# Patient Record
Sex: Male | Born: 1990 | Race: Black or African American | Hispanic: No | Marital: Single | Smoking: Current some day smoker
Health system: Southern US, Community
[De-identification: ages and names within clinical notes are randomized; demographics above are authoritative.]

---

## 2013-05-01 ENCOUNTER — Emergency Department (HOSPITAL_COMMUNITY)
Admission: EM | Admit: 2013-05-01 | Discharge: 2013-05-01 | Disposition: A | Discharge status: Home / Self Care | Payer: BC Managed Care – PPO | Attending: Emergency Medicine | Admitting: Emergency Medicine

## 2013-05-01 ENCOUNTER — Encounter (HOSPITAL_COMMUNITY): Payer: Self-pay | Admitting: Emergency Medicine

## 2013-05-01 DIAGNOSIS — H6121 Impacted cerumen, right ear: Secondary | ICD-10-CM

## 2013-05-01 DIAGNOSIS — H6691 Otitis media, unspecified, right ear: Secondary | ICD-10-CM

## 2013-05-01 DIAGNOSIS — H6120 Impacted cerumen, unspecified ear: Secondary | ICD-10-CM | POA: Insufficient documentation

## 2013-05-01 DIAGNOSIS — Z792 Long term (current) use of antibiotics: Secondary | ICD-10-CM | POA: Insufficient documentation

## 2013-05-01 DIAGNOSIS — Z880 Allergy status to penicillin: Secondary | ICD-10-CM | POA: Insufficient documentation

## 2013-05-01 DIAGNOSIS — H6690 Otitis media, unspecified, unspecified ear: Secondary | ICD-10-CM | POA: Insufficient documentation

## 2013-05-01 MED ORDER — ANTIPYRINE-BENZOCAINE 5.4-1.4 % OT SOLN
3.0000 [drp] | Freq: Once | OTIC | Status: AC
  Administered 2013-05-01: 3 [drp] via OTIC
  Filled 2013-05-01: qty 10

## 2013-05-01 MED ORDER — AZITHROMYCIN 250 MG PO TABS
500.0000 mg | ORAL_TABLET | Freq: Once | ORAL | Status: AC
  Administered 2013-05-01: 500 mg via ORAL
  Filled 2013-05-01: qty 2

## 2013-05-01 MED ORDER — AZITHROMYCIN 250 MG PO TABS: 250.0000 mg | ORAL_TABLET | Freq: Every day | ORAL | Status: DC

## 2013-05-01 NOTE — ED Notes (Signed)
Pt states that ear pain started Saturday and he noticed that ear wax was clogging his ear. Pt tried to removed wax with products from [facility] and still remains with wax and a unable to fully hear from R ear.

## 2013-05-01 NOTE — ED Provider Notes (Signed)
CSN: [id number]     Arrival date & time 05/01/13  2034 History  This chart was scribed for non-physician practitioner Earley Favor, NP working with Doug Sou, MD by Valera Castle, ED scribe. This patient was seen in room WTR9/WTR9 and the patient's care was started at 9:53 PM.   Chief Complaint  Patient presents with  . Cerumen Impaction    The history is provided by the patient. No language interpreter was used.   HPI Comments: Rodney Cooper is a 22 y.o. male who presents to the Emergency Department complaining of sudden clogged right ear, onset 3 days ago. Pt has tried to remove wax with [facility] products without success. He reports mild loss of hearing from his right ear. He states he has not seen the wax, but states he can feel that his ear is clogged. He reports a h/o similar symptoms, but states he can usually get his ear unclogged himself. Pt denies cold symptoms, rhinorrhea, and any other associated symptoms. He reports an allergy to Penicillin. Pt has no medical history.   PCP - No primary provider on file.  History reviewed. No pertinent past medical history. History reviewed. No pertinent past surgical history. History reviewed. No pertinent family history. History  Substance Use Topics  . Smoking status: Never Smoker   . Smokeless tobacco: Not on file  . Alcohol Use: Yes    Review of Systems  HENT: Positive for ear pain and hearing loss (right). Negative for rhinorrhea.        Positive for right ear clogging.  Neurological: Negative for dizziness and headaches.  All other systems reviewed and are negative.    Allergies  Amoxicillin and Penicillins  Home Medications   Current Outpatient Rx  Name  Route  Sig  Dispense  Refill  . carbamide peroxide (EAR DROPS EARWAX AID) 6.5 % otic solution   Right Ear   Place 5 drops into the right ear 2 (two) times daily.         Marland Kitchen azithromycin (ZITHROMAX) 250 MG tablet   Oral   Take 1 tablet (250 mg total) by mouth daily.   4  tablet   0    BP 118/67  Pulse 59  Temp(Src) 97.3 F (36.3 C) (Oral)  Resp 18  Wt 150 lb (68.04 kg)  SpO2 100%  Physical Exam  Nursing note and vitals reviewed. Constitutional: He is oriented to person, place, and time. He appears well-developed and well-nourished. No distress.  HENT:  Head: Normocephalic and atraumatic.  Right Ear: No swelling or tenderness. Decreased hearing is noted.  Left Ear: External ear normal.  Partial cerumen impaction  Eyes: EOM are normal.  Neck: Neck supple. No tracheal deviation present.  Cardiovascular: Normal rate.   Pulmonary/Chest: Effort normal. No respiratory distress.  Musculoskeletal: Normal range of motion.  Neurological: He is alert and oriented to person, place, and time.  Skin: Skin is warm and dry.  Psychiatric: He has a normal mood and affect. His behavior is normal.    ED Course  Procedures (including critical care time)  DIAGNOSTIC STUDIES: Oxygen Saturation is 100% on room air, normal by my interpretation.    COORDINATION OF CARE: 9:56 PM-Discussed treatment plan with pt at bedside and pt agreed to plan.   Labs Review Labs Reviewed - No data to display Imaging Review No results found.  EKG Interpretation   None       MDM   1. Cerumen impaction, right   2. Otitis media of  right ear     Ear was flushed, revealing a otitis media.  He has been started on azithromycin to to his penicillin allergy is also been given Auralgan  for comfort    I personally performed the services described in this documentation, which was scribed in my presence. The recorded information has been reviewed and is accurate.   Arman Filter, NP 05/01/13 2312

## 2013-05-01 NOTE — ED Provider Notes (Signed)
Medical screening examination/treatment/procedure(s) were performed by non-physician practitioner and as supervising physician I was immediately available for consultation/collaboration.  EKG Interpretation   None        Doug Sou, MD 05/01/13 [phone]

## 2014-03-07 ENCOUNTER — Emergency Department (HOSPITAL_COMMUNITY)
Admission: EM | Admit: 2014-03-07 | Discharge: 2014-03-08 | Disposition: A | Discharge status: Home / Self Care | Payer: Managed Care, Other (non HMO) | Attending: Emergency Medicine | Admitting: Emergency Medicine

## 2014-03-07 DIAGNOSIS — Y92410 Unspecified street and highway as the place of occurrence of the external cause: Secondary | ICD-10-CM | POA: Insufficient documentation

## 2014-03-07 DIAGNOSIS — Z792 Long term (current) use of antibiotics: Secondary | ICD-10-CM | POA: Insufficient documentation

## 2014-03-07 DIAGNOSIS — Z79899 Other long term (current) drug therapy: Secondary | ICD-10-CM | POA: Insufficient documentation

## 2014-03-07 DIAGNOSIS — S29012A Strain of muscle and tendon of back wall of thorax, initial encounter: Secondary | ICD-10-CM | POA: Diagnosis not present

## 2014-03-07 DIAGNOSIS — Y9389 Activity, other specified: Secondary | ICD-10-CM | POA: Diagnosis not present

## 2014-03-07 DIAGNOSIS — S39012A Strain of muscle, fascia and tendon of lower back, initial encounter: Secondary | ICD-10-CM | POA: Diagnosis not present

## 2014-03-07 DIAGNOSIS — Z880 Allergy status to penicillin: Secondary | ICD-10-CM | POA: Diagnosis not present

## 2014-03-07 DIAGNOSIS — S3992XA Unspecified injury of lower back, initial encounter: Secondary | ICD-10-CM | POA: Diagnosis present

## 2014-03-07 DIAGNOSIS — T148XXA Other injury of unspecified body region, initial encounter: Secondary | ICD-10-CM

## 2014-03-08 ENCOUNTER — Encounter (HOSPITAL_COMMUNITY): Payer: Self-pay | Admitting: Emergency Medicine

## 2014-03-08 DIAGNOSIS — S39012A Strain of muscle, fascia and tendon of lower back, initial encounter: Secondary | ICD-10-CM | POA: Diagnosis not present

## 2014-03-08 MED ORDER — HYDROCODONE-ACETAMINOPHEN 5-325 MG PO TABS
2.0000 | ORAL_TABLET | Freq: Once | ORAL | Status: AC
  Administered 2014-03-08: 2 via ORAL
  Filled 2014-03-08: qty 2

## 2014-03-08 MED ORDER — HYDROCODONE-ACETAMINOPHEN 5-325 MG PO TABS: 1.0000 | ORAL_TABLET | ORAL | Status: DC | PRN

## 2014-03-08 MED ORDER — CYCLOBENZAPRINE HCL 10 MG PO TABS: 10.0000 mg | ORAL_TABLET | Freq: Two times a day (BID) | ORAL | Status: DC | PRN

## 2014-03-08 MED ORDER — CYCLOBENZAPRINE HCL 10 MG PO TABS
10.0000 mg | ORAL_TABLET | Freq: Once | ORAL | Status: AC
  Administered 2014-03-08: 10 mg via ORAL
  Filled 2014-03-08: qty 1

## 2014-03-08 NOTE — ED Notes (Signed)
Pt ambulatory upon discharge. Pt denies pain.

## 2014-03-08 NOTE — ED Provider Notes (Signed)
Medical screening examination/treatment/procedure(s) were performed by non-physician practitioner and as supervising physician I was immediately available for consultation/collaboration.   EKG Interpretation None        Matthew Gentry, MD 03/08/14 2250 
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
## 2014-03-08 NOTE — Discharge Instructions (Signed)
Take Vicodin as needed for pain. Take Flexeril as needed for muscle spasm. Refer to attached documents for more information.  °

## 2014-07-16 ENCOUNTER — Encounter (HOSPITAL_COMMUNITY): Payer: Self-pay | Admitting: *Deleted

## 2014-07-16 ENCOUNTER — Emergency Department (HOSPITAL_COMMUNITY)
Admission: EM | Admit: 2014-07-16 | Discharge: 2014-07-16 | Disposition: A | Discharge status: Home / Self Care | Payer: Managed Care, Other (non HMO) | Attending: Emergency Medicine | Admitting: Emergency Medicine

## 2014-07-16 ENCOUNTER — Emergency Department (HOSPITAL_COMMUNITY): Payer: Managed Care, Other (non HMO)

## 2014-07-16 DIAGNOSIS — Z880 Allergy status to penicillin: Secondary | ICD-10-CM | POA: Insufficient documentation

## 2014-07-16 DIAGNOSIS — Y9389 Activity, other specified: Secondary | ICD-10-CM | POA: Diagnosis not present

## 2014-07-16 DIAGNOSIS — S060X1A Concussion with loss of consciousness of 30 minutes or less, initial encounter: Secondary | ICD-10-CM | POA: Diagnosis not present

## 2014-07-16 DIAGNOSIS — S0990XA Unspecified injury of head, initial encounter: Secondary | ICD-10-CM | POA: Diagnosis present

## 2014-07-16 DIAGNOSIS — Y998 Other external cause status: Secondary | ICD-10-CM | POA: Insufficient documentation

## 2014-07-16 DIAGNOSIS — Y92410 Unspecified street and highway as the place of occurrence of the external cause: Secondary | ICD-10-CM | POA: Diagnosis not present

## 2014-07-16 LAB — CBC WITH DIFFERENTIAL/PLATELET
BASOS ABS: 0 10*3/uL (ref 0.0–0.1)
Basophils Relative: 0 % (ref 0–1)
Eosinophils Absolute: 0.2 10*3/uL (ref 0.0–0.7)
Eosinophils Relative: 2 % (ref 0–5)
HCT: 45.1 % (ref 39.0–52.0)
Hemoglobin: 15.5 g/dL (ref 13.0–17.0)
LYMPHS ABS: 3.8 10*3/uL (ref 0.7–4.0)
LYMPHS PCT: 42 % (ref 12–46)
MCH: 30.3 pg (ref 26.0–34.0)
MCHC: 34.4 g/dL (ref 30.0–36.0)
MCV: 88.3 fL (ref 78.0–100.0)
MONO ABS: 0.7 10*3/uL (ref 0.1–1.0)
Monocytes Relative: 8 % (ref 3–12)
NEUTROS ABS: 4.5 10*3/uL (ref 1.7–7.7)
NEUTROS PCT: 48 % (ref 43–77)
Platelets: 245 10*3/uL (ref 150–400)
RBC: 5.11 MIL/uL (ref 4.22–5.81)
RDW: 13.8 % (ref 11.5–15.5)
WBC: 9.2 10*3/uL (ref 4.0–10.5)

## 2014-07-16 LAB — COMPREHENSIVE METABOLIC PANEL
ALBUMIN: 4.5 g/dL (ref 3.5–5.2)
ALK PHOS: 48 U/L (ref 39–117)
ALT: 23 U/L (ref 0–53)
AST: 18 U/L (ref 0–37)
Anion gap: 10 (ref 5–15)
BILIRUBIN TOTAL: 0.7 mg/dL (ref 0.3–1.2)
BUN: 9 mg/dL (ref 6–23)
CO2: 21 mmol/L (ref 19–32)
Calcium: 9.4 mg/dL (ref 8.4–10.5)
Chloride: 109 mmol/L (ref 96–112)
Creatinine, Ser: 1.07 mg/dL (ref 0.50–1.35)
GFR calc Af Amer: >90 mL/min (ref >90)
GFR calc non Af Amer: >90 mL/min (ref >90)
GLUCOSE: 112 mg/dL — AB (ref 70–99)
POTASSIUM: 3.9 mmol/L (ref 3.5–5.1)
Sodium: 140 mmol/L (ref 135–145)
Total Protein: 7.2 g/dL (ref 6.0–8.3)

## 2014-07-16 LAB — I-STAT TROPONIN, ED: Troponin i, poc: 0 ng/mL (ref 0.00–0.08)

## 2014-07-16 LAB — ETHANOL: Alcohol, Ethyl (B): <5 mg/dL (ref 0–9)

## 2014-07-16 MED ORDER — OXYCODONE-ACETAMINOPHEN 5-325 MG PO TABS: 1.0000 | ORAL_TABLET | Freq: Four times a day (QID) | ORAL | Status: DC | PRN

## 2014-07-16 MED ORDER — MORPHINE SULFATE 4 MG/ML IJ SOLN
4.0000 mg | Freq: Once | INTRAMUSCULAR | Status: AC
  Administered 2014-07-16: 4 mg via INTRAVENOUS
  Filled 2014-07-16: qty 1

## 2014-07-16 MED ORDER — CYCLOBENZAPRINE HCL 10 MG PO TABS: 10.0000 mg | ORAL_TABLET | Freq: Two times a day (BID) | ORAL | Status: DC | PRN

## 2014-07-16 MED ORDER — SODIUM CHLORIDE 0.9 % IV BOLUS (SEPSIS)
1000.0000 mL | Freq: Once | INTRAVENOUS | Status: AC
  Administered 2014-07-16: 1000 mL via INTRAVENOUS

## 2014-07-16 MED ORDER — OXYCODONE-ACETAMINOPHEN 5-325 MG PO TABS
2.0000 | ORAL_TABLET | Freq: Once | ORAL | Status: AC
  Administered 2014-07-16: 2 via ORAL
  Filled 2014-07-16: qty 2

## 2014-07-16 MED ORDER — IBUPROFEN 800 MG PO TABS: 800.0000 mg | ORAL_TABLET | Freq: Three times a day (TID) | ORAL | Status: DC
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
## 2014-07-16 NOTE — Discharge Instructions (Signed)
Take motrin for pain.   Rest for several days. You will be stiff and sore tomorrow.   Take percocet for severe pain. Do NOT drive with it.   Take flexeril for muscle spasms.   Follow up with your doctor.   Return to ER if you have severe pain, headache, vomiting.
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
## 2014-07-20 ENCOUNTER — Encounter (HOSPITAL_COMMUNITY): Payer: Self-pay | Admitting: Emergency Medicine

## 2014-07-20 ENCOUNTER — Emergency Department (HOSPITAL_COMMUNITY)
Admission: EM | Admit: 2014-07-20 | Discharge: 2014-07-20 | Disposition: A | Discharge status: Home / Self Care | Payer: Managed Care, Other (non HMO) | Attending: Emergency Medicine | Admitting: Emergency Medicine

## 2014-07-20 DIAGNOSIS — Z880 Allergy status to penicillin: Secondary | ICD-10-CM | POA: Insufficient documentation

## 2014-07-20 DIAGNOSIS — F0781 Postconcussional syndrome: Secondary | ICD-10-CM | POA: Insufficient documentation

## 2014-07-20 DIAGNOSIS — G44309 Post-traumatic headache, unspecified, not intractable: Secondary | ICD-10-CM | POA: Diagnosis present

## 2014-07-20 DIAGNOSIS — Z791 Long term (current) use of non-steroidal anti-inflammatories (NSAID): Secondary | ICD-10-CM | POA: Diagnosis not present

## 2014-07-20 DIAGNOSIS — R55 Syncope and collapse: Secondary | ICD-10-CM | POA: Diagnosis not present

## 2014-07-20 MED ORDER — IBUPROFEN 800 MG PO TABS: 800.0000 mg | ORAL_TABLET | Freq: Three times a day (TID) | ORAL | Status: DC | PRN
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
## 2014-07-20 NOTE — Discharge Instructions (Signed)
Post-Concussion Syndrome Post-concussion syndrome describes the symptoms that can occur after a head injury. These symptoms can last from weeks to months. CAUSES  It is not clear why some head injuries cause post-concussion syndrome. It can occur whether your head injury was mild or severe and whether you were wearing head protection or not.  SIGNS AND SYMPTOMS  Memory difficulties.  Dizziness.  Headaches.  Double vision or blurry vision.  Sensitivity to light.  Hearing difficulties.  Depression.  Tiredness.  Weakness.  Difficulty with concentration.  Difficulty sleeping or staying asleep.  Vomiting.  Poor balance or instability on your feet.  Slow reaction time.  Difficulty learning and remembering things you have heard. DIAGNOSIS  There is no test to determine whether you have post-concussion syndrome. Your health care provider may order an imaging scan of your brain, such as a CT scan, to check for other problems that may be causing your symptoms (such as severe injury inside your skull). TREATMENT  Usually, these problems disappear over time without medical care. Your health care provider may prescribe medicine to help ease your symptoms. It is important to follow up with a neurologist to evaluate your recovery and address any lingering symptoms or issues. HOME CARE INSTRUCTIONS   Only take over-the-counter or prescription medicines for pain, discomfort, or fever as directed by your health care provider. Do not take aspirin. Aspirin can slow blood clotting.  Sleep with your head slightly elevated to help with headaches.  Avoid any situation where there is potential for another head injury (football, hockey, soccer, basketball, martial arts, downhill snow sports, and horseback riding). Your condition will get worse every time you experience a concussion. You should avoid these activities until you are evaluated by the appropriate follow-up health care  providers.  Keep all follow-up appointments as directed by your health care provider. SEEK IMMEDIATE MEDICAL CARE IF:  You develop confusion or unusual drowsiness.  You cannot wake the injured person.  You develop nausea or persistent, forceful vomiting.  You feel like you are moving when you are not (vertigo).  You notice the injured person's eyes moving rapidly back and forth. This may be a sign of vertigo.  You have convulsions or faint.  You have severe, persistent headaches that are not relieved by medicine.  You cannot use your arms or legs normally.  Your pupils change size.  You have clear or bloody discharge from the nose or ears.  Your problems are getting worse, not better. MAKE SURE YOU:  Understand these instructions.  Will watch your condition.  Will get help right away if you are not doing well or get worse. Document Released: 10/31/2001 Document Revised: 03/01/2013 Document Reviewed: 08/16/2013 ExitCare Patient Information 2015 [employer]. This information is not intended to replace advice given to you by your health care provider. Make sure you discuss any questions you have with your health care provider.  

## 2014-08-14 ENCOUNTER — Emergency Department (HOSPITAL_COMMUNITY)
Admission: EM | Admit: 2014-08-14 | Discharge: 2014-08-14 | Disposition: A | Discharge status: Home / Self Care | Payer: Managed Care, Other (non HMO) | Source: Home / Self Care | Attending: Family Medicine | Admitting: Family Medicine

## 2014-08-14 ENCOUNTER — Encounter (HOSPITAL_COMMUNITY): Payer: Self-pay | Admitting: Emergency Medicine

## 2014-08-14 DIAGNOSIS — H6123 Impacted cerumen, bilateral: Secondary | ICD-10-CM | POA: Diagnosis not present

## 2014-08-14 NOTE — ED Provider Notes (Signed)
CSN: [id number]     Arrival date & time 08/14/14  [id number] History   First MD Initiated Contact with Patient 08/14/14 0919     Chief Complaint  Patient presents with  . Ear Fullness   (Consider location/radiation/quality/duration/timing/severity/associated sxs/prior Treatment) HPI         24 year old male presents requesting to have the earwax rinsed out of his years. He has a sensation of fullness. This is been bothering him for a few days. He tried to fix at home but could not rinse it out.  History reviewed. No pertinent past medical history. History reviewed. No pertinent past surgical history. No family history on file. History  Substance Use Topics  . Smoking status: Never Smoker   . Smokeless tobacco: Not on file  . Alcohol Use: Yes     Comment: occ    Review of Systems  HENT: Positive for ear pain and hearing loss.   All other systems reviewed and are negative.   Allergies  Amoxicillin and Penicillins  Home Medications   Prior to Admission medications   Medication Sig Start Date End Date Taking? Authorizing Provider  cyclobenzaprine (FLEXERIL) 10 MG tablet Take 1 tablet (10 mg total) by mouth 2 (two) times daily as needed for muscle spasms. 07/16/14   Richardean Canalavid H Yao, MD  HYDROcodone-acetaminophen (NORCO/VICODIN) 5-325 MG per tablet Take 1-2 tablets by mouth every 4 (four) hours as needed for moderate pain or severe pain. Patient not taking: Reported on 07/16/2014 03/08/14   Emilia BeckKaitlyn Szekalski, PA-C  ibuprofen (ADVIL,MOTRIN) 800 MG tablet Take 1 tablet (800 mg total) by mouth every 8 (eight) hours as needed for moderate pain. 07/20/14   Fayrene HelperBowie Tran, PA-C  oxyCODONE-acetaminophen (PERCOCET) 5-325 MG per tablet Take 1-2 tablets by mouth every 6 (six) hours as needed. 07/16/14   Richardean Canalavid H Yao, MD   BP 122/91 mmHg  Pulse 54  Temp(Src) 97.4 F (36.3 C) (Oral)  Resp 16  SpO2 100% Physical Exam  Constitutional: He is oriented to person, place, and time. He appears well-developed and  well-nourished. No distress.  HENT:  Head: Normocephalic.  Cerumen impaction bilaterally obscuring the TMs  Pulmonary/Chest: Effort normal. No respiratory distress.  Neurological: He is alert and oriented to person, place, and time. Coordination normal.  Skin: Skin is warm and dry. No rash noted. He is not diaphoretic.  Psychiatric: He has a normal mood and affect. Judgment normal.  Nursing note and vitals reviewed.   ED Course  Procedures (including critical care time) Labs Review Labs Reviewed - No data to display  Imaging Review No results found.   MDM   1. Cerumen impaction, bilateral    Rinsed out, no signs of any infection. Follow-up when necessary    Graylon GoodZachary H Khaalid Lefkowitz, PA-C 08/14/14 [phone]

## 2014-08-14 NOTE — Discharge Instructions (Signed)
Cerumen Impaction °A cerumen impaction is when the wax in your ear forms a plug. This plug usually causes reduced hearing. Sometimes it also causes an earache or dizziness. Removing a cerumen impaction can be difficult and painful. The wax sticks to the ear canal. The canal is sensitive and bleeds easily. If you try to remove a heavy wax buildup with a cotton tipped swab, you may push it in further. °Irrigation with water, suction, and small ear curettes may be used to clear out the wax. If the impaction is fixed to the skin in the ear canal, ear drops may be needed for a few days to loosen the wax. People who build up a lot of wax frequently can use ear wax removal products available in your local drugstore. °SEEK MEDICAL CARE IF:  °You develop an earache, increased hearing loss, or marked dizziness. °Document Released: 06/18/2004 Document Revised: 08/03/2011 Document Reviewed: 08/08/2009 °ExitCare® Patient Information ©2015 [employer]. This information is not intended to replace advice given to you by your health care provider. Make sure you discuss any questions you have with your health care provider. ° °

## 2014-08-14 NOTE — ED Notes (Addendum)
Patient c/o right ear fullness x 2 days. Patient reports he has tried peroxide and Debrox with no relief. Reports he cant hear now out of that ear. Patient is in NAD.

## 2015-12-06 ENCOUNTER — Ambulatory Visit (INDEPENDENT_AMBULATORY_CARE_PROVIDER_SITE_OTHER): Payer: BLUE CROSS/BLUE SHIELD | Admitting: Physician Assistant

## 2015-12-06 VITALS — BP 118/72 | HR 50 | Temp 98.1°F | Resp 15 | Ht 69.75 in | Wt 151.0 lb

## 2015-12-06 DIAGNOSIS — Z8669 Personal history of other diseases of the nervous system and sense organs: Secondary | ICD-10-CM | POA: Insufficient documentation

## 2015-12-06 DIAGNOSIS — H6123 Impacted cerumen, bilateral: Secondary | ICD-10-CM | POA: Diagnosis not present

## 2015-12-06 NOTE — Patient Instructions (Addendum)
Picking up a product like debrox will help to prevent the build up from recurring.   Cerumen Impaction The structures of the external ear canal secrete a waxy substance known as cerumen. Excess cerumen can build up in the ear canal, causing a condition known as cerumen impaction. Cerumen impaction can cause ear pain and disrupt the function of the ear. The rate of cerumen production differs for each individual. In certain individuals, the configuration of the ear canal may decrease his or her ability to naturally remove cerumen. CAUSES Cerumen impaction is caused by excessive cerumen production or buildup. RISK FACTORS  Frequent use of swabs to clean ears.  Having narrow ear canals.  Having eczema.  Being dehydrated. SIGNS AND SYMPTOMS  Diminished hearing.  Ear drainage.  Ear pain.  Ear itch. TREATMENT Treatment may involve:  Over-the-counter or prescription ear drops to soften the cerumen.  Removal of cerumen by a health care provider. This may be done with:  Irrigation with warm water. This is the most common method of removal.  Ear curettes and other instruments.  Surgery. This may be done in severe cases. HOME CARE INSTRUCTIONS  Take medicines only as directed by your health care provider.  Do not insert objects into the ear with the intent of cleaning the ear. PREVENTION  Do not insert objects into the ear, even with the intent of cleaning the ear. Removing cerumen as a part of normal hygiene is not necessary, and the use of swabs in the ear canal is not recommended.  Drink enough water to keep your urine clear or pale yellow.  Control your eczema if you have it. SEEK MEDICAL CARE IF:  You develop ear pain.  You develop bleeding from the ear.  The cerumen does not clear after you use ear drops as directed.   This information is not intended to replace advice given to you by your health care provider. Make sure you discuss any questions you have with your  health care provider.   Document Released: 06/18/2004 Document Revised: 06/01/2014 Document Reviewed: 12/26/2014 Elsevier Interactive Patient Education [employer]2016 Elsevier Inc.

## 2015-12-06 NOTE — Progress Notes (Signed)
   Subjective:    Patient ID: Rodney Cooper, male    DOB: [birth year], 24 y.o.   MRN: [id number]  Chief Complaint  Patient presents with  . Cerumen Impaction    left ear ongoing    Medications, allergies, past medical history, surgical history, family history, social history and problem list reviewed and updated.  HPI  [age] yom presents with bilateral cerumen impaction.   Chronic problem. Has had it flushed out in the past but never here. Last time was approx one year ago. Has tried hydrogen peroxide drops in the past but don't help.   Denies fevers, chills, HA.   Review of Systems See HPI     Objective:   Physical Exam  Constitutional: He is oriented to person, place, and time. He appears well-developed and well-nourished.  Non-toxic appearance. He does not have a sickly appearance. He does not appear ill. No distress.  BP 118/72 mmHg  Pulse 50  Temp(Src) 98.1 F (36.7 C) (Oral)  Resp 15  Ht 5' 9.75" (1.772 m)  Wt 151 lb (68.493 kg)  BMI 21.81 kg/m2  SpO2 99%   HENT:  Right Ear: Tympanic membrane normal.  Left Ear: Tympanic membrane normal.  Mouth/Throat: Uvula is midline, oropharynx is clear and moist and mucous membranes are normal.  Bilateral cerumen impaction with normal, slightly erythematous TMs post flushing.   Neurological: He is alert and oriented to person, place, and time.      Assessment & Plan:   H/O impacted cerumen  Cerumen impaction, bilateral --ears flushed bilaterally today, no signs infection post flushing  --encouraged starting debrox ear drops tomorrow to prolong recurrence   Donnajean Lopesodd M. Dakoda Bassette, PA-C Physician Assistant-Certified [facility]  12/06/2015 8:58 AM

## 2016-08-27 ENCOUNTER — Ambulatory Visit (INDEPENDENT_AMBULATORY_CARE_PROVIDER_SITE_OTHER): Payer: BLUE CROSS/BLUE SHIELD | Admitting: Physician Assistant

## 2016-08-27 ENCOUNTER — Encounter: Payer: Self-pay | Admitting: Physician Assistant

## 2016-08-27 VITALS — BP 103/67 | HR 59 | Temp 98.0°F | Resp 18 | Ht 69.75 in | Wt 154.0 lb

## 2016-08-27 DIAGNOSIS — J302 Other seasonal allergic rhinitis: Secondary | ICD-10-CM | POA: Diagnosis not present

## 2016-08-27 DIAGNOSIS — R0981 Nasal congestion: Secondary | ICD-10-CM | POA: Diagnosis not present

## 2016-08-27 MED ORDER — MONTELUKAST SODIUM 10 MG PO TABS: 10.0000 mg | ORAL_TABLET | Freq: Every day | ORAL | 3 refills | Status: AC

## 2016-08-27 MED ORDER — FLUTICASONE PROPIONATE 50 MCG/ACT NA SUSP: 2.0000 | Freq: Every day | NASAL | 6 refills | Status: DC

## 2016-08-27 NOTE — Patient Instructions (Addendum)
Drink 1-3 liters of water a day - this helps thin out mucus. Warm tea with honey and lemon  Flonase - 2 sprays each nostril twice a day.  Singulair for allergies.  Come back if you are not better in 1-2 weeks.    Acupuncture may help.   Thank you for coming in today. I hope you feel we met your needs.  Feel free to call [facility] if you have any questions or further requests.  Please consider signing up for MyChart if you do not already have it, as this is a great way to communicate with me.  Best,  Whitney McVey, PA-C  IF you received an x-ray today, you will receive an invoice from [facility]. Please contact [facility] at [phone] with questions or concerns regarding your invoice.   IF you received labwork today, you will receive an invoice from [facility]. Please contact [facility] at [phone] with questions or concerns regarding your invoice.   Our billing staff will not be able to assist you with questions regarding bills from these companies.  You will be contacted with the lab results as soon as they are available. The fastest way to get your results is to activate your My Chart account. Instructions are located on the last page of this paperwork. If you have not heard from [place] regarding the results in 2 weeks, please contact this office.

## 2016-08-27 NOTE — Progress Notes (Signed)
   Rodney Cooper  MRN: [id number] DOB: December 19, 1990  PCP: No PCP Per Patient  Subjective:  Pt is a 26 year old male who presents to clinic for allergies. Mucus is stuck in the back of his throat x 2 weeks. He occasionally coughs up clear mucus.  He has tried Mucinex, Benadryl and zyrtec. OTC medications always work - not this time.  Denies wheezing, cough, sneezing, watery eyes, SOB, chest tightness, fever, chills.   Review of Systems  Allergic/Immunologic: Positive for environmental allergies.    Patient Active Problem List   Diagnosis Date Noted  . H/O impacted cerumen 12/06/2015    No current outpatient prescriptions on file prior to visit.   No current facility-administered medications on file prior to visit.     Allergies  Allergen Reactions  . Amoxicillin Swelling  . Penicillins Swelling     Objective:  BP 103/67   Pulse (!) 59   Temp 98 F (36.7 C) (Oral)   Resp 18   Ht 5' 9.75" (1.772 m)   Wt 154 lb (69.9 kg)   SpO2 97%   BMI 22.26 kg/m   Physical Exam  Constitutional: He is oriented to person, place, and time and well-developed, well-nourished, and in no distress. No distress.  HENT:  Right Ear: Tympanic membrane is bulging.  Left Ear: Tympanic membrane normal.  Nose: Mucosal edema present. No rhinorrhea. Right sinus exhibits no maxillary sinus tenderness and no frontal sinus tenderness. Left sinus exhibits no maxillary sinus tenderness and no frontal sinus tenderness.  Mouth/Throat: Mucous membranes are normal. Posterior oropharyngeal edema present. No oropharyngeal exudate or posterior oropharyngeal erythema.  Cardiovascular: Normal rate, regular rhythm and normal heart sounds.   Pulmonary/Chest: Effort normal and breath sounds normal. He has no wheezes.  Neurological: He is alert and oriented to person, place, and time. GCS score is 15.  Skin: Skin is warm and dry.  Psychiatric: Mood, memory, affect and judgment normal.  Vitals reviewed.   Assessment  and Plan :  1. Chronic seasonal allergic rhinitis, unspecified trigger 2. Nasal congestion - montelukast (SINGULAIR) 10 MG tablet; Take 1 tablet (10 mg total) by mouth at bedtime.  Dispense: 30 tablet; Refill: 3 - fluticasone (FLONASE) 50 MCG/ACT nasal spray; Place 2 sprays into both nostrils daily.  Dispense: 16 g; Refill: 6 - Supportive care: warm salt water gargles, push fluids. RTC if no improvement.   Marco Collie, PA-C  Primary [facility] at [facility] 08/27/2016 10:10 AM

## 2017-08-30 ENCOUNTER — Ambulatory Visit: Payer: BLUE CROSS/BLUE SHIELD | Admitting: Family Medicine

## 2017-09-06 ENCOUNTER — Telehealth: Payer: Self-pay

## 2017-09-06 NOTE — Telephone Encounter (Signed)
Routine call made by Rodman Key Blanton to pt at [phone] to reschedule missed appt.  Per Rodman Key Blanton, when she asked "How are you?" pt responded "suicidal".  Rodman Key Blanton asked pt "are you ok?" and pt paused, made noise, and disconnected phone.  I c/b to pt at [phone] but no answer.  Pt has [phone] listed on release of information form.  Pt answered at that number by saying "Hey, I don't need that appointment".  Advised pt that I was calling to check on him as other staff member heard him state that he was suicidal.  Pt seemed to not know what I was talking about, stating he did not say that.  Confirmed again that staff member heard him say suicidal.  Pt denied.  Asked pt if he had any thoughts of hurting himself.  Pt denied.  Pt apologized for misunderstanding, stated he did not say that.  Pt states he had appt for his ears but ended up going somewhere else for tx.  Pt states he will c/b to get appt for physical later in the year.

## 2017-10-08 ENCOUNTER — Encounter: Payer: Self-pay | Admitting: Family Medicine

## 2017-10-13 ENCOUNTER — Encounter: Payer: Self-pay | Admitting: Family Medicine

## 2018-01-18 ENCOUNTER — Other Ambulatory Visit: Payer: Self-pay

## 2018-01-18 ENCOUNTER — Emergency Department (HOSPITAL_COMMUNITY)
Admission: EM | Admit: 2018-01-18 | Discharge: 2018-01-19 | Disposition: A | Discharge status: Home / Self Care | Payer: BLUE CROSS/BLUE SHIELD | Attending: Emergency Medicine | Admitting: Emergency Medicine

## 2018-01-18 ENCOUNTER — Emergency Department (HOSPITAL_COMMUNITY): Payer: BLUE CROSS/BLUE SHIELD

## 2018-01-18 ENCOUNTER — Encounter (HOSPITAL_COMMUNITY): Payer: Self-pay | Admitting: Emergency Medicine

## 2018-01-18 DIAGNOSIS — M791 Myalgia, unspecified site: Secondary | ICD-10-CM | POA: Insufficient documentation

## 2018-01-18 DIAGNOSIS — M7918 Myalgia, other site: Secondary | ICD-10-CM

## 2018-01-18 DIAGNOSIS — Y998 Other external cause status: Secondary | ICD-10-CM | POA: Diagnosis not present

## 2018-01-18 DIAGNOSIS — Y92410 Unspecified street and highway as the place of occurrence of the external cause: Secondary | ICD-10-CM | POA: Insufficient documentation

## 2018-01-18 DIAGNOSIS — M545 Low back pain: Secondary | ICD-10-CM | POA: Diagnosis not present

## 2018-01-18 DIAGNOSIS — F17210 Nicotine dependence, cigarettes, uncomplicated: Secondary | ICD-10-CM | POA: Insufficient documentation

## 2018-01-18 DIAGNOSIS — M25512 Pain in left shoulder: Secondary | ICD-10-CM | POA: Insufficient documentation

## 2018-01-18 DIAGNOSIS — Z79899 Other long term (current) drug therapy: Secondary | ICD-10-CM | POA: Insufficient documentation

## 2018-01-18 DIAGNOSIS — S0990XA Unspecified injury of head, initial encounter: Secondary | ICD-10-CM | POA: Diagnosis present

## 2018-01-18 DIAGNOSIS — Y939 Activity, unspecified: Secondary | ICD-10-CM | POA: Insufficient documentation
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
## 2018-01-19 MED ORDER — METHOCARBAMOL 500 MG PO TABS: 500.0000 mg | ORAL_TABLET | Freq: Two times a day (BID) | ORAL | 0 refills | Status: AC

## 2018-01-19 MED ORDER — NAPROXEN 500 MG PO TABS: 500.0000 mg | ORAL_TABLET | Freq: Two times a day (BID) | ORAL | 0 refills | Status: AC

## 2018-01-19 NOTE — ED Notes (Signed)
Pt called out, explained that wait is due to provider seeing multiple patients. Pt asked for water. Asked provider and gave pt water
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
## 2018-01-19 NOTE — ED Notes (Signed)
Pt called out saying he wants to leave and he is hungry. Informed of wait. Pt given Malawiturkey sandwich and crackers
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
## 2018-06-10 ENCOUNTER — Emergency Department (HOSPITAL_COMMUNITY)
Admission: EM | Admit: 2018-06-10 | Discharge: 2018-06-10 | Disposition: A | Discharge status: Home / Self Care | Payer: BLUE CROSS/BLUE SHIELD | Attending: Emergency Medicine | Admitting: Emergency Medicine

## 2018-06-10 ENCOUNTER — Encounter (HOSPITAL_COMMUNITY): Payer: Self-pay | Admitting: Emergency Medicine

## 2018-06-10 ENCOUNTER — Emergency Department (HOSPITAL_COMMUNITY): Payer: BLUE CROSS/BLUE SHIELD

## 2018-06-10 ENCOUNTER — Other Ambulatory Visit: Payer: Self-pay

## 2018-06-10 DIAGNOSIS — F17210 Nicotine dependence, cigarettes, uncomplicated: Secondary | ICD-10-CM | POA: Diagnosis not present

## 2018-06-10 DIAGNOSIS — R0989 Other specified symptoms and signs involving the circulatory and respiratory systems: Secondary | ICD-10-CM

## 2018-06-10 DIAGNOSIS — Z79899 Other long term (current) drug therapy: Secondary | ICD-10-CM | POA: Insufficient documentation

## 2018-06-10 DIAGNOSIS — R05 Cough: Secondary | ICD-10-CM | POA: Diagnosis present

## 2018-06-10 DIAGNOSIS — J9801 Acute bronchospasm: Secondary | ICD-10-CM | POA: Diagnosis not present

## 2018-06-10 MED ORDER — IPRATROPIUM-ALBUTEROL 0.5-2.5 (3) MG/3ML IN SOLN
3.0000 mL | Freq: Once | RESPIRATORY_TRACT | Status: AC
  Administered 2018-06-10: 3 mL via RESPIRATORY_TRACT
  Filled 2018-06-10: qty 3

## 2018-06-10 MED ORDER — ALBUTEROL SULFATE 108 (90 BASE) MCG/ACT IN AEPB: 2.0000 | INHALATION_SPRAY | RESPIRATORY_TRACT | 0 refills | Status: DC | PRN

## 2018-06-10 NOTE — Discharge Instructions (Addendum)
How to use a dry powder inhaler Make sure you remove any gum or candy from your mouth. Stand or sit up straight. Activate the medicine in your inhaler, depending on the type of DPI you have. Turn your head and breathe out. Do not breathe out into the mouthpiece. Turn your head back to the mouthpiece and seal your lips around it. Take a deep breath through your mouth. Do not breathe through your nose. Hold your breath for 10 seconds. Remove the inhaler from your mouth, turn your head, and breathe out slowly. You may not feel the medicine going into your lungs. This is normal. Check the dose indicator number on the inhaler. It should go down when you use it. Rinse your mouth with water. Do not swallow the water.  Contact a health care provider if: You have muscle aches. You have chest pain. The mucus that you cough up (sputum) changes from clear or white to yellow, green, gray, or bloody. You have a fever. Your sputum gets thicker. Get help right away if: Your wheezing and coughing get worse, even after you take your prescribed medicines. It gets even harder to breathe. You develop severe chest pain.

## 2018-06-10 NOTE — ED Triage Notes (Signed)
Patient here from home with complaints of cough, nasal congestion for the past week. Reports OTC meds with no relief. Denies fever.

## 2018-06-10 NOTE — ED Provider Notes (Signed)
[facility]-EMERGENCY DEPT Provider Note   CSN: [id number] Arrival date & time: 06/10/18  [id number]     History   Chief Complaint Chief Complaint  Patient presents with  . Nasal Congestion  . Cough    HPI Rodney Cooper is a 28 y.o. male who presents emergency department with chief complaint of chest congestion and URI symptoms.  He has had symptoms for about 1 week.  He has been using over-the-counter mucolytic medications such as Mucinex without relief of his symptoms.  He states that he gets into fits of coughing but is unable to clear the chest congestion.  He denies fevers, chills or wheezing.  He has a history of environmental allergies but denies a history of asthma.   HPI  History reviewed. No pertinent past medical history.  Patient Active Problem List   Diagnosis Date Noted  . H/O impacted cerumen 12/06/2015    History reviewed. No pertinent surgical history.      Home Medications    Prior to Admission medications   Medication Sig Start Date End Date Taking? Authorizing Provider  fluticasone (FLONASE) 50 MCG/ACT nasal spray Place 2 sprays into both nostrils daily. 08/27/16   McVey, Madelaine BhatElizabeth Whitney, PA-C  methocarbamol (ROBAXIN) 500 MG tablet Take 1 tablet (500 mg total) by mouth 2 (two) times daily. 01/19/18   Harlene SaltsMorelli, Brandon A, PA-C  montelukast (SINGULAIR) 10 MG tablet Take 1 tablet (10 mg total) by mouth at bedtime. 08/27/16   McVey, Madelaine BhatElizabeth Whitney, PA-C  naproxen (NAPROSYN) 500 MG tablet Take 1 tablet (500 mg total) by mouth 2 (two) times daily. 01/19/18   Bill SalinasMorelli, Brandon A, PA-C    Family History No family history on file.  Social History Social History   Tobacco Use  . Smoking status: Current Some Day Smoker    Types: Cigarettes  . Smokeless tobacco: Never Used  Substance Use Topics  . Alcohol use: Yes    Comment: occ  . Drug use: No     Allergies   Amoxicillin and Penicillins   Review of Systems Review of Systems  Ten  systems reviewed and are negative for acute change, except as noted in the HPI.   Physical Exam Updated Vital Signs BP 116/74   Pulse 64   Temp 98.7 F (37.1 C) (Oral)   Resp 16   SpO2 98%   Physical Exam Vitals signs and nursing note reviewed.  Constitutional:      General: He is not in acute distress.    Appearance: He is well-developed. He is not diaphoretic.  HENT:     Head: Normocephalic and atraumatic.  Eyes:     General: No scleral icterus.    Conjunctiva/sclera: Conjunctivae normal.  Neck:     Musculoskeletal: Normal range of motion and neck supple.  Cardiovascular:     Rate and Rhythm: Normal rate and regular rhythm.     Heart sounds: Normal heart sounds.  Pulmonary:     Effort: Pulmonary effort is normal. No respiratory distress.     Breath sounds: Normal breath sounds.  Abdominal:     Palpations: Abdomen is soft.     Tenderness: There is no abdominal tenderness.  Skin:    General: Skin is warm and dry.  Neurological:     Mental Status: He is alert.  Psychiatric:        Behavior: Behavior normal.      ED Treatments / Results  Labs (all labs ordered are listed, but only abnormal results are  displayed) Labs Reviewed - No data to display  EKG None  Radiology Dg Chest 2 View  Result Date: 06/10/2018 CLINICAL DATA:  Chest congestion and cough for 2 weeks EXAM: CHEST - 2 VIEW COMPARISON:  None. FINDINGS: The heart size and mediastinal contours are within normal limits. Both lungs are clear. The visualized skeletal structures are unremarkable. IMPRESSION: Negative chest. Electronically Signed   By: Marnee SpringJonathon  Watts M.D.   On: 06/10/2018 04:44    Procedures Procedures (including critical care time)  Medications Ordered in ED Medications  ipratropium-albuterol (DUONEB) 0.5-2.5 (3) MG/3ML nebulizer solution 3 mL (has no administration in time range)     Initial Impression / Assessment and Plan / ED Course  I have reviewed the triage vital signs and the  nursing notes.  Pertinent labs & imaging results that were available during my care of the patient were reviewed by me and considered in my medical decision making (see chart for details).     Patient with negative chest x-ray.  I personally reviewed the images and agree with radiologic interpretation.  I suspect that the patient is having bronchospasm related to his upper respiratory infection.  Patient given DuoNeb here.  He will be discharged with dry powder albuterol inhaler (pro-air respite click) as he is not versed in use of a pump albuterol MDI.  Patient is advised to continue with drinking plenty of liquids, continue with mucolytics.  Gust return precautions.  Patient appears appropriate for discharge at this time  Final Clinical Impressions(s) / ED Diagnoses   Final diagnoses:  None    ED Discharge Orders    None       Arthor CaptainHarris, Saralyn Willison, PA-C 06/10/18 0616    Paula LibraMolpus, John, MD 06/10/18 [phone]

## 2018-11-30 ENCOUNTER — Other Ambulatory Visit: Payer: Self-pay | Admitting: Critical Care Medicine

## 2018-11-30 DIAGNOSIS — Z20822 Contact with and (suspected) exposure to covid-19: Secondary | ICD-10-CM

## 2018-12-05 LAB — NOVEL CORONAVIRUS, NAA: SARS-CoV-2, NAA: NOT DETECTED

## 2019-01-16 ENCOUNTER — Other Ambulatory Visit: Payer: Self-pay

## 2019-01-16 ENCOUNTER — Encounter (HOSPITAL_COMMUNITY): Payer: Self-pay

## 2019-01-16 ENCOUNTER — Ambulatory Visit (HOSPITAL_COMMUNITY)
Admission: EM | Admit: 2019-01-16 | Discharge: 2019-01-16 | Disposition: A | Discharge status: Home / Self Care | Payer: 59 | Attending: Family Medicine | Admitting: Family Medicine

## 2019-01-16 DIAGNOSIS — Z79899 Other long term (current) drug therapy: Secondary | ICD-10-CM | POA: Insufficient documentation

## 2019-01-16 DIAGNOSIS — R05 Cough: Secondary | ICD-10-CM | POA: Diagnosis not present

## 2019-01-16 DIAGNOSIS — R059 Cough, unspecified: Secondary | ICD-10-CM

## 2019-01-16 DIAGNOSIS — Z880 Allergy status to penicillin: Secondary | ICD-10-CM | POA: Diagnosis not present

## 2019-01-16 DIAGNOSIS — Z20822 Contact with and (suspected) exposure to covid-19: Secondary | ICD-10-CM

## 2019-01-16 DIAGNOSIS — Z20828 Contact with and (suspected) exposure to other viral communicable diseases: Secondary | ICD-10-CM | POA: Insufficient documentation

## 2019-01-16 DIAGNOSIS — F17210 Nicotine dependence, cigarettes, uncomplicated: Secondary | ICD-10-CM | POA: Insufficient documentation

## 2019-01-16 DIAGNOSIS — Z791 Long term (current) use of non-steroidal anti-inflammatories (NSAID): Secondary | ICD-10-CM | POA: Insufficient documentation

## 2019-01-16 NOTE — ED Provider Notes (Signed)
[facility]    CSN: [id number] Arrival date & time: 01/16/19  5093      History   Chief Complaint Chief Complaint  Patient presents with  . covid test    HPI Rodney Cooper is a 28 y.o. male.   Patient presents with a nonproductive cough x2 days.  He works in a [employer] and was instructed to get a COVID test.  A coworker is COVID positive.  He denies fever, chills, rhinorrhea, otalgia, sore throat, shortness of breath, abdominal pain, vomiting, diarrhea, or other symptoms.  The history is provided by the patient.    History reviewed. No pertinent past medical history.  Patient Active Problem List   Diagnosis Date Noted  . H/O impacted cerumen 12/06/2015    History reviewed. No pertinent surgical history.     Home Medications    Prior to Admission medications   Medication Sig Start Date End Date Taking? Authorizing Provider  Albuterol Sulfate (PROAIR RESPICLICK) 267 (90 Base) MCG/ACT AEPB Inhale 2 puffs into the lungs every 4 (four) hours as needed (cough and wheezing). 06/10/18   Harris, Abigail, PA-C  fluticasone (FLONASE) 50 MCG/ACT nasal spray Place 2 sprays into both nostrils daily. 08/27/16   McVey, Gelene Mink, PA-C  methocarbamol (ROBAXIN) 500 MG tablet Take 1 tablet (500 mg total) by mouth 2 (two) times daily. 01/19/18   Nuala Alpha A, PA-C  montelukast (SINGULAIR) 10 MG tablet Take 1 tablet (10 mg total) by mouth at bedtime. 08/27/16   McVey, Gelene Mink, PA-C  naproxen (NAPROSYN) 500 MG tablet Take 1 tablet (500 mg total) by mouth 2 (two) times daily. 01/19/18   Deliah Boston, PA-C    Family History History reviewed. No pertinent family history.  Social History Social History   Tobacco Use  . Smoking status: Current Some Day Smoker    Types: Cigarettes  . Smokeless tobacco: Never Used  Substance Use Topics  . Alcohol use: Yes    Comment: occ  . Drug use: No     Allergies   Amoxicillin and Penicillins   Review of  Systems Review of Systems  Constitutional: Negative for chills and fever.  HENT: Negative for congestion, ear pain, rhinorrhea and sore throat.   Eyes: Negative for pain and visual disturbance.  Respiratory: Positive for cough. Negative for shortness of breath.   Cardiovascular: Negative for chest pain and palpitations.  Gastrointestinal: Negative for abdominal pain, diarrhea and vomiting.  Genitourinary: Negative for dysuria and hematuria.  Musculoskeletal: Negative for arthralgias and back pain.  Skin: Negative for color change and rash.  Neurological: Negative for seizures and syncope.  All other systems reviewed and are negative.    Physical Exam Triage Vital Signs ED Triage Vitals  Enc Vitals Group     BP 01/16/19 1041 121/73     Pulse Rate 01/16/19 1041 68     Resp 01/16/19 1041 18     Temp 01/16/19 1041 97.8 F (36.6 C)     Temp Source 01/16/19 1041 Oral     SpO2 01/16/19 1041 100 %     Weight 01/16/19 1039 160 lb (72.6 kg)     Height --      Head Circumference --      Peak Flow --      Pain Score 01/16/19 1038 0     Pain Loc --      Pain Edu? --      Excl. in [place]? --    No data found.  Updated Vital Signs BP 121/73 (BP Location: Right Arm)   Pulse 68   Temp 97.8 F (36.6 C) (Oral)   Resp 18   Wt 160 lb (72.6 kg)   SpO2 100%   BMI 22.32 kg/m   Visual Acuity Right Eye Distance:   Left Eye Distance:   Bilateral Distance:    Right Eye Near:   Left Eye Near:    Bilateral Near:     Physical Exam Vitals signs and nursing note reviewed.  Constitutional:      Appearance: He is well-developed.  HENT:     Head: Normocephalic and atraumatic.     Right Ear: Tympanic membrane normal.     Left Ear: Tympanic membrane normal.     Nose: Nose normal.     Mouth/Throat:     Mouth: Mucous membranes are moist.     Pharynx: Oropharynx is clear.  Eyes:     Conjunctiva/sclera: Conjunctivae normal.  Neck:     Musculoskeletal: Neck supple.  Cardiovascular:      Rate and Rhythm: Normal rate and regular rhythm.     Heart sounds: No murmur.  Pulmonary:     Effort: Pulmonary effort is normal. No respiratory distress.     Breath sounds: Normal breath sounds.  Abdominal:     General: Bowel sounds are normal.     Palpations: Abdomen is soft.     Tenderness: There is no abdominal tenderness. There is no guarding or rebound.  Skin:    General: Skin is warm and dry.     Findings: No rash.  Neurological:     Mental Status: He is alert.      UC Treatments / Results  Labs (all labs ordered are listed, but only abnormal results are displayed) Labs Reviewed  NOVEL CORONAVIRUS, NAA (HOSPITAL ORDER, SEND-OUT TO REF LAB)    EKG   Radiology No results found.  Procedures Procedures (including critical care time)  Medications Ordered in UC Medications - No data to display  Initial Impression / Assessment and Plan / UC Course  I have reviewed the triage vital signs and the nursing notes.  Pertinent labs & imaging results that were available during my care of the patient were reviewed by me and considered in my medical decision making (see chart for details).    Cough, suspected COVID.  COVID test performed here.  Discussed with patient that he should self quarantine until his COVID test result is back.  Instructed patient to go to the emergency department if he develops shortness of breath, high fever, severe diarrhea, or other concerning symptoms.     Final Clinical Impressions(s) / UC Diagnoses   Final diagnoses:  Suspected Covid-19 Virus Infection  Cough     Discharge Instructions     Your COVID test is pending.  You should self quarantine until your test result is back and is negative.    Go to the emergency department if you develop shortness of breath, high fever, severe diarrhea, or other concerning symptoms.        ED Prescriptions    None     Controlled Substance Prescriptions [place] Controlled Substance Registry  consulted? Not Applicable   Mickie Bailate, Egidio Lofgren H, NP 01/16/19 1106

## 2019-01-16 NOTE — Discharge Instructions (Addendum)
Your COVID test is pending.  You should self quarantine until your test result is back and is negative.   ° °Go to the emergency department if you develop shortness of breath, high fever, severe diarrhea, or other concerning symptoms.   ° ° ° °

## 2019-01-16 NOTE — ED Triage Notes (Signed)
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

## 2019-01-18 LAB — NOVEL CORONAVIRUS, NAA (HOSP ORDER, SEND-OUT TO REF LAB; TAT 18-24 HRS): SARS-CoV-2, NAA: NOT DETECTED

## 2019-04-10 ENCOUNTER — Encounter (HOSPITAL_COMMUNITY): Payer: Self-pay

## 2019-04-10 ENCOUNTER — Ambulatory Visit (HOSPITAL_COMMUNITY)
Admission: EM | Admit: 2019-04-10 | Discharge: 2019-04-10 | Disposition: A | Discharge status: Home / Self Care | Payer: 59 | Attending: Internal Medicine | Admitting: Internal Medicine

## 2019-04-10 ENCOUNTER — Other Ambulatory Visit: Payer: Self-pay

## 2019-04-10 DIAGNOSIS — R0982 Postnasal drip: Secondary | ICD-10-CM | POA: Diagnosis not present

## 2019-04-10 DIAGNOSIS — Z20828 Contact with and (suspected) exposure to other viral communicable diseases: Secondary | ICD-10-CM | POA: Diagnosis not present

## 2019-04-10 DIAGNOSIS — J309 Allergic rhinitis, unspecified: Secondary | ICD-10-CM

## 2019-04-10 DIAGNOSIS — R0981 Nasal congestion: Secondary | ICD-10-CM | POA: Diagnosis present

## 2019-04-10 MED ORDER — FLUTICASONE PROPIONATE 50 MCG/ACT NA SUSP: 2.0000 | Freq: Every day | NASAL | 6 refills | Status: AC

## 2019-04-10 MED ORDER — GUAIFENESIN ER 600 MG PO TB12: 600.0000 mg | ORAL_TABLET | Freq: Two times a day (BID) | ORAL | 0 refills | Status: AC

## 2019-04-10 NOTE — ED Triage Notes (Signed)
Pt presents with chest congestion and nasal congestion X 1 week.

## 2019-04-10 NOTE — ED Provider Notes (Signed)
[facility]    CSN: [id number] Arrival date & time: 04/10/19  0806      History   Chief Complaint Chief Complaint  Patient presents with  . Congestion    HPI Rodney Cooper is a 28 y.o. male with no past medical history comes to urgent care with 1 week history of nasal congestion and cough.  Patient says symptoms started insidiously and is currently persistent.  No chills.  He was persuaded by his job to come for evaluation.  Patient denies any shortness of breath.  No sputum production.  Patient denies any generalized body aches.  No sick contacts.  No nausea or vomiting.  No loss of taste or smell.  He has not tried any over-the-counter medications.   Patient has a history of allergies but has not used any of his nasal spray.  Admits to experiencing some postnasal drainage  HPI  History reviewed. No pertinent past medical history.  Patient Active Problem List   Diagnosis Date Noted  . H/O impacted cerumen 12/06/2015    History reviewed. No pertinent surgical history.     Home Medications    Prior to Admission medications   Medication Sig Start Date End Date Taking? Authorizing Provider  Albuterol Sulfate (PROAIR RESPICLICK) 108 (90 Base) MCG/ACT AEPB Inhale 2 puffs into the lungs every 4 (four) hours as needed (cough and wheezing). 06/10/18   Harris, Abigail, PA-C  fluticasone (FLONASE) 50 MCG/ACT nasal spray Place 2 sprays into both nostrils daily. 04/10/19   Merrilee JanskyLamptey, Philip O, MD  guaiFENesin (MUCINEX) 600 MG 12 hr tablet Take 1 tablet (600 mg total) by mouth 2 (two) times daily. 04/10/19   Lamptey, Britta MccreedyPhilip O, MD  methocarbamol (ROBAXIN) 500 MG tablet Take 1 tablet (500 mg total) by mouth 2 (two) times daily. 01/19/18   Harlene SaltsMorelli, Brandon A, PA-C  montelukast (SINGULAIR) 10 MG tablet Take 1 tablet (10 mg total) by mouth at bedtime. 08/27/16   McVey, Madelaine BhatElizabeth Whitney, PA-C  naproxen (NAPROSYN) 500 MG tablet Take 1 tablet (500 mg total) by mouth 2 (two) times daily.  01/19/18   Bill SalinasMorelli, Brandon A, PA-C    Family History History reviewed. No pertinent family history.  Social History Social History   Tobacco Use  . Smoking status: Current Some Day Smoker    Types: Cigarettes  . Smokeless tobacco: Never Used  Substance Use Topics  . Alcohol use: Yes    Comment: occ  . Drug use: No     Allergies   Amoxicillin and Penicillins   Review of Systems Review of Systems  Constitutional: Negative.   HENT: Positive for congestion. Negative for ear discharge, ear pain, hearing loss, sinus pressure and sinus pain.   Eyes: Negative for discharge and itching.  Respiratory: Positive for cough. Negative for shortness of breath and wheezing.   Cardiovascular: Negative.   Gastrointestinal: Negative for diarrhea, nausea and vomiting.  Genitourinary: Negative.   Musculoskeletal: Negative.  Negative for arthralgias and joint swelling.  Skin: Negative.   Neurological: Negative.  Negative for dizziness and light-headedness.     Physical Exam Triage Vital Signs ED Triage Vitals  Enc Vitals Group     BP 04/10/19 0829 121/84     Pulse Rate 04/10/19 0829 (!) 57     Resp 04/10/19 0829 17     Temp 04/10/19 0829 98.8 F (37.1 C)     Temp Source 04/10/19 0829 Oral     SpO2 04/10/19 0829 100 %     Weight --  Height --      Head Circumference --      Peak Flow --      Pain Score 04/10/19 0830 2     Pain Loc --      Pain Edu? --      Excl. in [place]? --    No data found.  Updated Vital Signs BP 121/84 (BP Location: Right Arm)   Pulse (!) 57   Temp 98.8 F (37.1 C) (Oral)   Resp 17   SpO2 100%   Visual Acuity Right Eye Distance:   Left Eye Distance:   Bilateral Distance:    Right Eye Near:   Left Eye Near:    Bilateral Near:     Physical Exam Vitals signs and nursing note reviewed.  Constitutional:      General: He is not in acute distress.    Appearance: He is not ill-appearing.  HENT:     Right Ear: Tympanic membrane normal. There is  no impacted cerumen.     Left Ear: There is no impacted cerumen.     Nose: Nose normal. No rhinorrhea.     Mouth/Throat:     Mouth: Mucous membranes are moist.     Pharynx: No oropharyngeal exudate or posterior oropharyngeal erythema.  Cardiovascular:     Rate and Rhythm: Normal rate and regular rhythm.  Pulmonary:     Effort: Pulmonary effort is normal. No respiratory distress.     Breath sounds: Normal breath sounds. No wheezing or rhonchi.  Abdominal:     General: Bowel sounds are normal.     Tenderness: There is no abdominal tenderness. There is no guarding or rebound.  Musculoskeletal:        General: No swelling or tenderness.  Skin:    General: Skin is warm.     Capillary Refill: Capillary refill takes less than 2 seconds.     Findings: No bruising or erythema.  Neurological:     General: No focal deficit present.     Mental Status: He is alert and oriented to person, place, and time.      UC Treatments / Results  Labs (all labs ordered are listed, but only abnormal results are displayed) Labs Reviewed  NOVEL CORONAVIRUS, NAA (HOSP ORDER, SEND-OUT TO REF LAB; TAT 18-24 HRS)    EKG   Radiology No results found.  Procedures Procedures (including critical care time)  Medications Ordered in UC Medications - No data to display  Initial Impression / Assessment and Plan / UC Course  I have reviewed the triage vital signs and the nursing notes.  Pertinent labs & imaging results that were available during my care of the patient were reviewed by me and considered in my medical decision making (see chart for details).     1. Allergic rhinitis with postnasal drip: Restart Flonase Mucinex 600mg  BID If patient experiences worsening shortness of breath, fever or chills he is advised to return to urgent care to be reevaluated  Final Clinical Impressions(s) / UC Diagnoses   Final diagnoses:  Allergic rhinitis with postnasal drip   Discharge Instructions   None     ED Prescriptions    Medication Sig Dispense Auth. Provider   fluticasone (FLONASE) 50 MCG/ACT nasal spray Place 2 sprays into both nostrils daily. 16 g Chase Picket, MD   guaiFENesin (MUCINEX) 600 MG 12 hr tablet Take 1 tablet (600 mg total) by mouth 2 (two) times daily. 20 tablet Lamptey, Myrene Galas, MD  PDMP not reviewed this encounter.   Merrilee Jansky, MD 04/11/19 2113

## 2019-04-12 LAB — NOVEL CORONAVIRUS, NAA (HOSP ORDER, SEND-OUT TO REF LAB; TAT 18-24 HRS): SARS-CoV-2, NAA: NOT DETECTED

## 2019-05-20 ENCOUNTER — Ambulatory Visit (HOSPITAL_COMMUNITY)
Admission: EM | Admit: 2019-05-20 | Discharge: 2019-05-20 | Disposition: A | Discharge status: Home / Self Care | Payer: 59 | Attending: Emergency Medicine | Admitting: Emergency Medicine

## 2019-05-20 ENCOUNTER — Other Ambulatory Visit: Payer: Self-pay

## 2019-05-20 ENCOUNTER — Encounter (HOSPITAL_COMMUNITY): Payer: Self-pay

## 2019-05-20 DIAGNOSIS — Z20828 Contact with and (suspected) exposure to other viral communicable diseases: Secondary | ICD-10-CM | POA: Diagnosis not present

## 2019-05-20 DIAGNOSIS — Z20822 Contact with and (suspected) exposure to covid-19: Secondary | ICD-10-CM

## 2019-05-20 NOTE — ED Triage Notes (Signed)
Pt presents for covid testing after possible exposure on christmas eve; pt is not displaying any symptoms.

## 2019-05-20 NOTE — Discharge Instructions (Signed)
Person Under Monitoring Name: Rodney Cooper  Location: [place] [address]   Infection Prevention Recommendations for Individuals Confirmed to have, or Being Evaluated for, 2019 Novel Coronavirus (COVID-19) Infection Who Receive Care at Home  Individuals who are confirmed to have, or are being evaluated for, COVID-19 should follow the prevention steps below until a healthcare provider or local or state health department says they can return to normal activities.  Stay home except to get medical care You should restrict activities outside your home, except for getting medical care. Do not go to work, school, or public areas, and do not use public transportation or taxis.  Call ahead before visiting your doctor Before your medical appointment, call the healthcare provider and tell them that you have, or are being evaluated for, COVID-19 infection. This will help the healthcare provider's office take steps to keep other people from getting infected. Ask your healthcare provider to call the local or state health department.  Monitor your symptoms Seek prompt medical attention if your illness is worsening (e.g., difficulty breathing). Before going to your medical appointment, call the healthcare provider and tell them that you have, or are being evaluated for, COVID-19 infection. Ask your healthcare provider to call the local or state health department.  Wear a facemask You should wear a facemask that covers your nose and mouth when you are in the same room with other people and when you visit a healthcare provider. People who live with or visit you should also wear a facemask while they are in the same room with you.  Separate yourself from other people in your home As much as possible, you should stay in a different room from other people in your home. Also, you should use a separate bathroom, if available.  Avoid sharing household items You should not share  dishes, drinking glasses, cups, eating utensils, towels, bedding, or other items with other people in your home. After using these items, you should wash them thoroughly with soap and water.  Cover your coughs and sneezes Cover your mouth and nose with a tissue when you cough or sneeze, or you can cough or sneeze into your sleeve. Throw used tissues in a lined trash can, and immediately wash your hands with soap and water for at least 20 seconds or use an alcohol-based hand rub.  Wash your [employer] your hands often and thoroughly with soap and water for at least 20 seconds. You can use an alcohol-based hand sanitizer if soap and water are not available and if your hands are not visibly dirty. Avoid touching your eyes, nose, and mouth with unwashed hands.   Prevention Steps for Caregivers and Household Members of Individuals Confirmed to have, or Being Evaluated for, COVID-19 Infection Being Cared for in the Home  If you live with, or provide care at home for, a person confirmed to have, or being evaluated for, COVID-19 infection please follow these guidelines to prevent infection:  Follow healthcare provider's instructions Make sure that you understand and can help the patient follow any healthcare provider instructions for all care.  Provide for the patient's basic needs You should help the patient with basic needs in the home and provide support for getting groceries, prescriptions, and other personal needs.  Monitor the patient's symptoms If they are getting sicker, call his or her medical provider and tell them that the patient has, or is being evaluated for, COVID-19 infection. This will help the healthcare provider's office  take steps to keep other people from getting infected. Ask the healthcare provider to call the local or state health department.  Limit the number of people who have contact with the patient If possible, have only one caregiver for the patient. Other  household members should stay in another home or place of residence. If this is not possible, they should stay in another room, or be separated from the patient as much as possible. Use a separate bathroom, if available. Restrict visitors who do not have an essential need to be in the home.  Keep older adults, very young children, and other sick people away from the patient Keep older adults, very young children, and those who have compromised immune systems or chronic health conditions away from the patient. This includes people with chronic heart, lung, or kidney conditions, diabetes, and cancer.  Ensure good ventilation Make sure that shared spaces in the home have good air flow, such as from an air conditioner or an opened window, weather permitting.  Wash your hands often Wash your hands often and thoroughly with soap and water for at least 20 seconds. You can use an alcohol based hand sanitizer if soap and water are not available and if your hands are not visibly dirty. Avoid touching your eyes, nose, and mouth with unwashed hands. Use disposable paper towels to dry your hands. If not available, use dedicated cloth towels and replace them when they become wet.  Wear a facemask and gloves Wear a disposable facemask at all times in the room and gloves when you touch or have contact with the patient's blood, body fluids, and/or secretions or excretions, such as sweat, saliva, sputum, nasal mucus, vomit, urine, or feces.  Ensure the mask fits over your nose and mouth tightly, and do not touch it during use. Throw out disposable facemasks and gloves after using them. Do not reuse. Wash your hands immediately after removing your facemask and gloves. If your personal clothing becomes contaminated, carefully remove clothing and launder. Wash your hands after handling contaminated clothing. Place all used disposable facemasks, gloves, and other waste in a lined container before disposing them with  other household waste. Remove gloves and wash your hands immediately after handling these items.  Do not share dishes, glasses, or other household items with the patient Avoid sharing household items. You should not share dishes, drinking glasses, cups, eating utensils, towels, bedding, or other items with a patient who is confirmed to have, or being evaluated for, COVID-19 infection. After the person uses these items, you should wash them thoroughly with soap and water.  Wash laundry thoroughly Immediately remove and wash clothes or bedding that have blood, body fluids, and/or secretions or excretions, such as sweat, saliva, sputum, nasal mucus, vomit, urine, or feces, on them. Wear gloves when handling laundry from the patient. Read and follow directions on labels of laundry or clothing items and detergent. In general, wash and dry with the warmest temperatures recommended on the label.  Clean all areas the individual has used often Clean all touchable surfaces, such as counters, tabletops, doorknobs, bathroom fixtures, toilets, phones, keyboards, tablets, and bedside tables, every day. Also, clean any surfaces that may have blood, body fluids, and/or secretions or excretions on them. Wear gloves when cleaning surfaces the patient has come in contact with. Use a diluted bleach solution (e.g., dilute bleach with 1 part bleach and 10 parts water) or a household disinfectant with a label that says EPA-registered for coronaviruses. To make a bleach  solution at home, add 1 tablespoon of bleach to 1 quart (4 cups) of water. For a larger supply, add  cup of bleach to 1 gallon (16 cups) of water. Read labels of cleaning products and follow recommendations provided on product labels. Labels contain instructions for safe and effective use of the cleaning product including precautions you should take when applying the product, such as wearing gloves or eye protection and making sure you have good ventilation  during use of the product. Remove gloves and wash hands immediately after cleaning.  Monitor yourself for signs and symptoms of illness Caregivers and household members are considered close contacts, should monitor their health, and will be asked to limit movement outside of the home to the extent possible. Follow the monitoring steps for close contacts listed on the symptom monitoring form.   ? If you have additional questions, contact your local health department or call the epidemiologist on call at [phone] (available 24/7). ? This guidance is subject to change. For the most up-to-date guidance from [facility], please refer to their website: [email or web address]

## 2019-05-21 LAB — NOVEL CORONAVIRUS, NAA (HOSP ORDER, SEND-OUT TO REF LAB; TAT 18-24 HRS): SARS-CoV-2, NAA: NOT DETECTED

## 2019-05-21 NOTE — ED Provider Notes (Signed)
[facility]    CSN: [id number] Arrival date & time: 05/20/19  1623      History   Chief Complaint Chief Complaint  Patient presents with  . Covid Testing    HPI Rodney Cooper is a 28 y.o. male no significant past medical history presenting today for Covid testing.  Patient states that he had exposure to positive Covid on Christmas eve.  States that exposure would have been for less than 1 hour as well as he was wearing mask and had no close contact to this person.  He denies any symptoms at this time.  Denies cough, congestion or sore throat.  Denies fevers chills or body aches.  Denies nausea vomiting or diarrhea.  Denies loss of appetite smell or taste.   HPI  History reviewed. No pertinent past medical history.  Patient Active Problem List   Diagnosis Date Noted  . H/O impacted cerumen 12/06/2015    History reviewed. No pertinent surgical history.     Home Medications    Prior to Admission medications   Medication Sig Start Date End Date Taking? Authorizing Provider  Albuterol Sulfate (PROAIR RESPICLICK) 108 (90 Base) MCG/ACT AEPB Inhale 2 puffs into the lungs every 4 (four) hours as needed (cough and wheezing). 06/10/18   Harris, Abigail, PA-C  fluticasone (FLONASE) 50 MCG/ACT nasal spray Place 2 sprays into both nostrils daily. 04/10/19   Merrilee Jansky, MD  guaiFENesin (MUCINEX) 600 MG 12 hr tablet Take 1 tablet (600 mg total) by mouth 2 (two) times daily. 04/10/19   Lamptey, Britta Mccreedy, MD  methocarbamol (ROBAXIN) 500 MG tablet Take 1 tablet (500 mg total) by mouth 2 (two) times daily. 01/19/18   Harlene Salts A, PA-C  montelukast (SINGULAIR) 10 MG tablet Take 1 tablet (10 mg total) by mouth at bedtime. 08/27/16   McVey, Madelaine Bhat, PA-C  naproxen (NAPROSYN) 500 MG tablet Take 1 tablet (500 mg total) by mouth 2 (two) times daily. 01/19/18   Bill Salinas, PA-C    Family History History reviewed. No pertinent family history.  Social History  Social History   Tobacco Use  . Smoking status: Current Some Day Smoker    Types: Cigarettes  . Smokeless tobacco: Never Used  Substance Use Topics  . Alcohol use: Yes    Comment: occ  . Drug use: No     Allergies   Amoxicillin and Penicillins   Review of Systems Review of Systems  Constitutional: Negative for activity change, appetite change, chills, fatigue and fever.  HENT: Negative for congestion, ear pain, rhinorrhea, sinus pressure, sore throat and trouble swallowing.   Eyes: Negative for discharge and redness.  Respiratory: Negative for cough, chest tightness and shortness of breath.   Cardiovascular: Negative for chest pain.  Gastrointestinal: Negative for abdominal pain, diarrhea, nausea and vomiting.  Musculoskeletal: Negative for myalgias.  Skin: Negative for rash.  Neurological: Negative for dizziness, light-headedness and headaches.     Physical Exam Triage Vital Signs ED Triage Vitals  Enc Vitals Group     BP 05/20/19 1725 116/81     Pulse Rate 05/20/19 1725 91     Resp 05/20/19 1729 18     Temp 05/20/19 1725 99.2 F (37.3 C)     Temp Source 05/20/19 1725 Oral     SpO2 05/20/19 1725 100 %     Weight --      Height --      Head Circumference --      Peak Flow --  Pain Score 05/20/19 1729 0     Pain Loc --      Pain Edu? --      Excl. in [place]? --    No data found.  Updated Vital Signs BP 116/81 (BP Location: Left Arm)   Pulse 91   Temp 99.2 F (37.3 C) (Oral)   Resp 18   SpO2 100%   Visual Acuity Right Eye Distance:   Left Eye Distance:   Bilateral Distance:    Right Eye Near:   Left Eye Near:    Bilateral Near:     Physical Exam Vitals and nursing note reviewed.  Constitutional:      Appearance: He is well-developed.     Comments: No acute distress  HENT:     Head: Normocephalic and atraumatic.     Nose: Nose normal.  Eyes:     Conjunctiva/sclera: Conjunctivae normal.  Cardiovascular:     Rate and Rhythm: Normal rate.   Pulmonary:     Effort: Pulmonary effort is normal. No respiratory distress.     Comments: Breathing comfortably at rest, CTABL, no wheezing, rales or other adventitious sounds auscultated Abdominal:     General: There is no distension.  Musculoskeletal:        General: Normal range of motion.     Cervical back: Neck supple.  Skin:    General: Skin is warm and dry.  Neurological:     Mental Status: He is alert and oriented to person, place, and time.      UC Treatments / Results  Labs (all labs ordered are listed, but only abnormal results are displayed) Labs Reviewed  NOVEL CORONAVIRUS, NAA (HOSP ORDER, SEND-OUT TO REF LAB; TAT 18-24 HRS)    EKG   Radiology No results found.  Procedures Procedures (including critical care time)  Medications Ordered in UC Medications - No data to display  Initial Impression / Assessment and Plan / UC Course  I have reviewed the triage vital signs and the nursing notes.  Pertinent labs & imaging results that were available during my care of the patient were reviewed by me and considered in my medical decision making (see chart for details).     Covid exposure 3 days ago.  Currently asymptomatic.  Covid swab pending.  Discussed possible false negatives shortly after exposure.  Recommended to continue to monitor for development of any symptoms and would recommend retesting if developing.  Discussed strict return precautions. Patient verbalized understanding and is agreeable with plan.  Final Clinical Impressions(s) / UC Diagnoses   Final diagnoses:  Exposure to COVID-19 virus  Encounter for laboratory testing for COVID-19 virus     Discharge Instructions        Person Under Monitoring Name: Rodney Cooper  Location: [place] [address]   Infection Prevention Recommendations for Individuals Confirmed to have, or Being Evaluated for, 2019 Novel Coronavirus (COVID-19) Infection Who Receive Care at Home   Individuals who are confirmed to have, or are being evaluated for, COVID-19 should follow the prevention steps below until a healthcare provider or local or state health department says they can return to normal activities.  Stay home except to get medical care You should restrict activities outside your home, except for getting medical care. Do not go to work, school, or public areas, and do not use public transportation or taxis.  Call ahead before visiting your doctor Before your medical appointment, call the healthcare provider and tell them that you have,  or are being evaluated for, COVID-19 infection. This will help the healthcare provider's office take steps to keep other people from getting infected. Ask your healthcare provider to call the local or state health department.  Monitor your symptoms Seek prompt medical attention if your illness is worsening (e.g., difficulty breathing). Before going to your medical appointment, call the healthcare provider and tell them that you have, or are being evaluated for, COVID-19 infection. Ask your healthcare provider to call the local or state health department.  Wear a facemask You should wear a facemask that covers your nose and mouth when you are in the same room with other people and when you visit a healthcare provider. People who live with or visit you should also wear a facemask while they are in the same room with you.  Separate yourself from other people in your home As much as possible, you should stay in a different room from other people in your home. Also, you should use a separate bathroom, if available.  Avoid sharing household items You should not share dishes, drinking glasses, cups, eating utensils, towels, bedding, or other items with other people in your home. After using these items, you should wash them thoroughly with soap and water.  Cover your coughs and sneezes Cover your mouth and nose with a tissue when you  cough or sneeze, or you can cough or sneeze into your sleeve. Throw used tissues in a lined trash can, and immediately wash your hands with soap and water for at least 20 seconds or use an alcohol-based hand rub.  Wash your [employer] your hands often and thoroughly with soap and water for at least 20 seconds. You can use an alcohol-based hand sanitizer if soap and water are not available and if your hands are not visibly dirty. Avoid touching your eyes, nose, and mouth with unwashed hands.   Prevention Steps for Caregivers and Household Members of Individuals Confirmed to have, or Being Evaluated for, COVID-19 Infection Being Cared for in the Home  If you live with, or provide care at home for, a person confirmed to have, or being evaluated for, COVID-19 infection please follow these guidelines to prevent infection:  Follow healthcare provider's instructions Make sure that you understand and can help the patient follow any healthcare provider instructions for all care.  Provide for the patient's basic needs You should help the patient with basic needs in the home and provide support for getting groceries, prescriptions, and other personal needs.  Monitor the patient's symptoms If they are getting sicker, call his or her medical provider and tell them that the patient has, or is being evaluated for, COVID-19 infection. This will help the healthcare provider's office take steps to keep other people from getting infected. Ask the healthcare provider to call the local or state health department.  Limit the number of people who have contact with the patient  If possible, have only one caregiver for the patient.  Other household members should stay in another home or place of residence. If this is not possible, they should stay  in another room, or be separated from the patient as much as possible. Use a separate bathroom, if available.  Restrict visitors who do not have an essential  need to be in the home.  Keep older adults, very young children, and other sick people away from the patient Keep older adults, very young children, and those who have compromised immune systems or chronic health conditions away from the  patient. This includes people with chronic heart, lung, or kidney conditions, diabetes, and cancer.  Ensure good ventilation Make sure that shared spaces in the home have good air flow, such as from an air conditioner or an opened window, weather permitting.  Wash your hands often  Wash your hands often and thoroughly with soap and water for at least 20 seconds. You can use an alcohol based hand sanitizer if soap and water are not available and if your hands are not visibly dirty.  Avoid touching your eyes, nose, and mouth with unwashed hands.  Use disposable paper towels to dry your hands. If not available, use dedicated cloth towels and replace them when they become wet.  Wear a facemask and gloves  Wear a disposable facemask at all times in the room and gloves when you touch or have contact with the patient's blood, body fluids, and/or secretions or excretions, such as sweat, saliva, sputum, nasal mucus, vomit, urine, or feces.  Ensure the mask fits over your nose and mouth tightly, and do not touch it during use.  Throw out disposable facemasks and gloves after using them. Do not reuse.  Wash your hands immediately after removing your facemask and gloves.  If your personal clothing becomes contaminated, carefully remove clothing and launder. Wash your hands after handling contaminated clothing.  Place all used disposable facemasks, gloves, and other waste in a lined container before disposing them with other household waste.  Remove gloves and wash your hands immediately after handling these items.  Do not share dishes, glasses, or other household items with the patient  Avoid sharing household items. You should not share dishes, drinking  glasses, cups, eating utensils, towels, bedding, or other items with a patient who is confirmed to have, or being evaluated for, COVID-19 infection.  After the person uses these items, you should wash them thoroughly with soap and water.  Wash laundry thoroughly  Immediately remove and wash clothes or bedding that have blood, body fluids, and/or secretions or excretions, such as sweat, saliva, sputum, nasal mucus, vomit, urine, or feces, on them.  Wear gloves when handling laundry from the patient.  Read and follow directions on labels of laundry or clothing items and detergent. In general, wash and dry with the warmest temperatures recommended on the label.  Clean all areas the individual has used often  Clean all touchable surfaces, such as counters, tabletops, doorknobs, bathroom fixtures, toilets, phones, keyboards, tablets, and bedside tables, every day. Also, clean any surfaces that may have blood, body fluids, and/or secretions or excretions on them.  Wear gloves when cleaning surfaces the patient has come in contact with.  Use a diluted bleach solution (e.g., dilute bleach with 1 part bleach and 10 parts water) or a household disinfectant with a label that says EPA-registered for coronaviruses. To make a bleach solution at home, add 1 tablespoon of bleach to 1 quart (4 cups) of water. For a larger supply, add  cup of bleach to 1 gallon (16 cups) of water.  Read labels of cleaning products and follow recommendations provided on product labels. Labels contain instructions for safe and effective use of the cleaning product including precautions you should take when applying the product, such as wearing gloves or eye protection and making sure you have good ventilation during use of the product.  Remove gloves and wash hands immediately after cleaning.  Monitor yourself for signs and symptoms of illness Caregivers and household members are considered close contacts, should monitor their  health, and will be asked to limit movement outside of the home to the extent possible. Follow the monitoring steps for close contacts listed on the symptom monitoring form.   ? If you have additional questions, contact your local health department or call the epidemiologist on call at [phone] (available 24/7). ? This guidance is subject to change. For the most up-to-date guidance from [facility], please refer to their website: TripMetro.huhttps://www.cdc.gov/coronavirus/2019-ncov/hcp/guidance-prevent-spread.html   ED Prescriptions    None     PDMP not reviewed this encounter.   Adelaine Roppolo, BabcockHallie C, PA-C 05/21/19 1016

## 2019-09-12 ENCOUNTER — Ambulatory Visit (HOSPITAL_COMMUNITY)
Admission: EM | Admit: 2019-09-12 | Discharge: 2019-09-12 | Disposition: A | Discharge status: Home / Self Care | Payer: 59 | Attending: Family Medicine | Admitting: Family Medicine

## 2019-09-12 ENCOUNTER — Other Ambulatory Visit: Payer: Self-pay

## 2019-09-12 ENCOUNTER — Encounter (HOSPITAL_COMMUNITY): Payer: Self-pay

## 2019-09-12 DIAGNOSIS — Z880 Allergy status to penicillin: Secondary | ICD-10-CM | POA: Diagnosis not present

## 2019-09-12 DIAGNOSIS — Z20822 Contact with and (suspected) exposure to covid-19: Secondary | ICD-10-CM | POA: Diagnosis not present

## 2019-09-12 DIAGNOSIS — K529 Noninfective gastroenteritis and colitis, unspecified: Secondary | ICD-10-CM | POA: Diagnosis not present

## 2019-09-12 DIAGNOSIS — F17210 Nicotine dependence, cigarettes, uncomplicated: Secondary | ICD-10-CM | POA: Insufficient documentation

## 2019-09-12 DIAGNOSIS — R112 Nausea with vomiting, unspecified: Secondary | ICD-10-CM | POA: Diagnosis present

## 2019-09-12 MED ORDER — ONDANSETRON 4 MG PO TBDP
4.0000 mg | ORAL_TABLET | Freq: Once | ORAL | Status: AC
  Administered 2019-09-12: 11:00:00 4 mg via ORAL

## 2019-09-12 MED ORDER — ONDANSETRON 4 MG PO TBDP: 4.0000 mg | ORAL_TABLET | Freq: Three times a day (TID) | ORAL | 0 refills | Status: AC | PRN

## 2019-09-12 MED ORDER — ONDANSETRON 4 MG PO TBDP
ORAL_TABLET | ORAL | Status: AC
  Filled 2019-09-12: qty 1

## 2019-09-12 NOTE — ED Triage Notes (Signed)
Pt presents with chills, vomiting, diarrhea, and generalized body aches since waking up this morning.

## 2019-09-12 NOTE — Discharge Instructions (Addendum)
Please do your best to ensure adequate fluid intake in order to avoid dehydration. If you find that you are unable to tolerate drinking fluids regularly please proceed to the Emergency Department for evaluation.  Also, you should return to the hospital if you experience persistent fevers for greater than 1-2 more days, increasing abdominal pain that persists despite medications, persistent diarrhea, dizziness, syncope (fainting), or for any other concerns you may deem worrisome.  You have been tested for COVID-19 today. If your test returns positive, you will receive a phone call from [place] regarding your results. Negative test results are not called. Both positive and negative results area always visible on MyChart. If you do not have a MyChart account, sign up instructions are provided in your discharge papers. Please do not hesitate to contact us should you have questions or concerns.  

## 2019-09-12 NOTE — ED Provider Notes (Signed)
[facility]   [id number] 09/12/19 Arrival Time: 0936  ASSESSMENT & PLAN:  1. Gastroenteritis     No signs of dehydration requiring IVF at this time. Tolerating sips of PO fluids here.  Begin: Meds ordered this encounter  Medications  . ondansetron (ZOFRAN-ODT) disintegrating tablet 4 mg  . ondansetron (ZOFRAN-ODT) 4 MG disintegrating tablet    Sig: Take 1 tablet (4 mg total) by mouth every 8 (eight) hours as needed for nausea or vomiting.    Dispense:  15 tablet    Refill:  0    COVID testing sent. Discussed typical duration of symptoms for suspected viral GI illness. Will do his best to ensure adequate fluid intake in order to avoid dehydration. Will proceed to the Emergency Department for evaluation if unable to tolerate PO fluids regularly. Work note provided.  Otherwise he will f/u with his PCP or here if not showing improvement over the next 48-72 hours.  Reviewed expectations re: course of current medical issues. Questions answered. Outlined signs and symptoms indicating need for more acute intervention. Patient verbalized understanding. After Visit Summary given.   SUBJECTIVE: History from: patient.  Rodney Cooper is a 29 y.o. male who presents with complaint of non-bilious, non-bloody intermittent n/v with non-bloody diarrhea. Onset approx 0300 today. Abdominal discomfort: mild and cramping. Symptoms are unchanged since beginning. Aggravating factors: eating. Alleviating factors: none identified. Associated symptoms: fatigue. He denies arthralgias, belching, fever and myalgias. Was chilled at symptom onset. Appetite: decreased. PO intake: decreased. Ambulatory without assistance. Urinary symptoms: none. Sick contacts: none. Recent travel or camping: none. OTC treatment: none.  History reviewed. No pertinent surgical history.   OBJECTIVE:  Vitals:   09/12/19 1054  BP: 110/71  Pulse: 93  Resp: 20  Temp: 99.1 F (37.3 C)  TempSrc: Oral  SpO2:  100%    General appearance: alert; no distress Oropharynx: moist Lungs: speaks full sentences without difficulty; unlabored Heart: regular Abdomen: soft; non-distended; no significant abdominal tenderness; no guarding or rebound tenderness Back: no CVA tenderness Extremities: no edema; symmetrical with no gross deformities Skin: warm; dry Neurologic: normal gait Psychological: alert and cooperative; normal mood and affect  Labs:  Labs Reviewed  SARS CORONAVIRUS 2 (TAT 6-24 HRS)     Allergies  Allergen Reactions  . Amoxicillin Swelling  . Penicillins Swelling                                               History reviewed. No pertinent past medical history.   Social History   Socioeconomic History  . Marital status: Single    Spouse name: Not on file  . Number of children: Not on file  . Years of education: Not on file  . Highest education level: Not on file  Occupational History  . Not on file  Tobacco Use  . Smoking status: Current Some Day Smoker    Types: Cigarettes  . Smokeless tobacco: Never Used  Substance and Sexual Activity  . Alcohol use: Yes    Comment: occ  . Drug use: No  . Sexual activity: Not on file  Other Topics Concern  . Not on file  Social History Narrative  . Not on file   Social Determinants of Health   Financial Resource Strain:   . Difficulty of Paying Living Expenses:   Food Insecurity:   . Worried About Estate manager/land agent  of Food in the Last Year:   . Ran Out of Food in the Last Year:   Transportation Needs:   . Lack of Transportation (Medical):   Marland Kitchen Lack of Transportation (Non-Medical):   Physical Activity:   . Days of Exercise per Week:   . Minutes of Exercise per Session:   Stress:   . Feeling of Stress :   Social Connections:   . Frequency of Communication with Friends and Family:   . Frequency of Social Gatherings with Friends and Family:   . Attends Religious Services:   . Active Member of Clubs or Organizations:   .  Attends Banker Meetings:   Marland Kitchen Marital Status:   Intimate Partner Violence:   . Fear of Current or Ex-Partner:   . Emotionally Abused:   Marland Kitchen Physically Abused:   . Sexually Abused:    History reviewed. No pertinent family history.   Mardella Layman, MD 09/12/19 1229

## 2019-09-13 LAB — SARS CORONAVIRUS 2 (TAT 6-24 HRS): SARS Coronavirus 2: NEGATIVE

## 2019-10-03 ENCOUNTER — Ambulatory Visit (HOSPITAL_COMMUNITY)
Admission: EM | Admit: 2019-10-03 | Discharge: 2019-10-03 | Disposition: A | Discharge status: Home / Self Care | Payer: 59 | Attending: Family Medicine | Admitting: Family Medicine

## 2019-10-03 ENCOUNTER — Encounter (HOSPITAL_COMMUNITY): Payer: Self-pay

## 2019-10-03 ENCOUNTER — Other Ambulatory Visit: Payer: Self-pay

## 2019-10-03 DIAGNOSIS — R05 Cough: Secondary | ICD-10-CM

## 2019-10-03 DIAGNOSIS — J069 Acute upper respiratory infection, unspecified: Secondary | ICD-10-CM | POA: Diagnosis not present

## 2019-10-03 DIAGNOSIS — R0982 Postnasal drip: Secondary | ICD-10-CM

## 2019-10-03 DIAGNOSIS — R059 Cough, unspecified: Secondary | ICD-10-CM

## 2019-10-03 MED ORDER — PREDNISONE 50 MG PO TABS: 50.0000 mg | ORAL_TABLET | Freq: Every day | ORAL | 0 refills | Status: AC

## 2019-10-03 MED ORDER — BENZONATATE 200 MG PO CAPS: 200.0000 mg | ORAL_CAPSULE | Freq: Three times a day (TID) | ORAL | 0 refills | Status: AC | PRN

## 2019-10-03 MED ORDER — ALBUTEROL SULFATE HFA 108 (90 BASE) MCG/ACT IN AERS: 1.0000 | INHALATION_SPRAY | Freq: Four times a day (QID) | RESPIRATORY_TRACT | 0 refills | Status: AC | PRN

## 2019-10-03 NOTE — Discharge Instructions (Addendum)
Continue taking allergy medicine Add in prednisone daily for the next 5 days- take with food and in the morning if you are able Tessalon/benzonatate as needed for cough Albuterol inhaler as needed for wheezing and shortness of breath, chest tightness  Follow up if not improving or worsening

## 2019-10-03 NOTE — ED Triage Notes (Signed)
Patient reports cough. Denies any other symptoms. Reports this feels similar to when allergies last year and had to take prednisone.

## 2019-10-04 NOTE — ED Provider Notes (Signed)
[facility]    CSN: [id number] Arrival date & time: 10/03/19  1931      History   Chief Complaint Chief Complaint  Patient presents with  . Cough    HPI Rodney Cooper is a 29 y.o. male history of allergic rhinitis presenting today for evaluation of cough.  Patient notes that he has had a cough for approximately 1 week.  He has also had nasal congestion.  Feels similar to prior allergy symptoms.  Reports he was Covid tested approximately 2 weeks ago which was negative.  He has been using Zyrtec and Mucinex without relief.  Has been using Flonase which is helpful.  He reports he typically improves with prednisone and albuterol inhaler.  He denies chest pain or shortness of breath.  HPI  History reviewed. No pertinent past medical history.  Patient Active Problem List   Diagnosis Date Noted  . H/O impacted cerumen 12/06/2015    History reviewed. No pertinent surgical history.     Home Medications    Prior to Admission medications   Medication Sig Start Date End Date Taking? Authorizing Provider  albuterol (VENTOLIN HFA) 108 (90 Base) MCG/ACT inhaler Inhale 1-2 puffs into the lungs every 6 (six) hours as needed for wheezing or shortness of breath. 10/03/19   ,  C, PA-C  benzonatate (TESSALON) 200 MG capsule Take 1 capsule (200 mg total) by mouth 3 (three) times daily as needed for up to 7 days for cough. 10/03/19 10/10/19  ,  C, PA-C  fluticasone (FLONASE) 50 MCG/ACT nasal spray Place 2 sprays into both nostrils daily. 04/10/19   Chase Picket, MD  guaiFENesin (MUCINEX) 600 MG 12 hr tablet Take 1 tablet (600 mg total) by mouth 2 (two) times daily. 04/10/19   Lamptey, Myrene Galas, MD  methocarbamol (ROBAXIN) 500 MG tablet Take 1 tablet (500 mg total) by mouth 2 (two) times daily. 01/19/18   Nuala Alpha A, PA-C  montelukast (SINGULAIR) 10 MG tablet Take 1 tablet (10 mg total) by mouth at bedtime. 08/27/16   McVey, Gelene Mink, PA-C   naproxen (NAPROSYN) 500 MG tablet Take 1 tablet (500 mg total) by mouth 2 (two) times daily. 01/19/18   Nuala Alpha A, PA-C  ondansetron (ZOFRAN-ODT) 4 MG disintegrating tablet Take 1 tablet (4 mg total) by mouth every 8 (eight) hours as needed for nausea or vomiting. 09/12/19   Vanessa Kick, MD  predniSONE (DELTASONE) 50 MG tablet Take 1 tablet (50 mg total) by mouth daily for 5 days. 10/03/19 10/08/19  , Elesa Hacker, PA-C    Family History History reviewed. No pertinent family history.  Social History Social History   Tobacco Use  . Smoking status: Current Some Day Smoker    Types: Cigarettes  . Smokeless tobacco: Never Used  Substance Use Topics  . Alcohol use: Yes    Comment: occ  . Drug use: No     Allergies   Amoxicillin and Penicillins   Review of Systems Review of Systems  Constitutional: Negative for activity change, appetite change, chills, fatigue and fever.  HENT: Positive for congestion and rhinorrhea. Negative for ear pain, sinus pressure, sore throat and trouble swallowing.   Eyes: Negative for discharge and redness.  Respiratory: Positive for cough. Negative for chest tightness and shortness of breath.   Cardiovascular: Negative for chest pain.  Gastrointestinal: Negative for abdominal pain, diarrhea, nausea and vomiting.  Musculoskeletal: Negative for myalgias.  Skin: Negative for rash.  Neurological: Negative for dizziness, light-headedness and headaches.  Physical Exam Triage Vital Signs ED Triage Vitals  Enc Vitals Group     BP 10/03/19 2007 115/78     Pulse Rate 10/03/19 2007 86     Resp 10/03/19 2007 16     Temp 10/03/19 2007 98.3 F (36.8 C)     Temp Source 10/03/19 2007 Oral     SpO2 10/03/19 2007 99 %     Weight --      Height --      Head Circumference --      Peak Flow --      Pain Score 10/03/19 2005 0     Pain Loc --      Pain Edu? --      Excl. in GC? --    No data found.  Updated Vital Signs BP 115/78 (BP  Location: Right Arm)   Pulse 86   Temp 98.3 F (36.8 C) (Oral)   Resp 16   SpO2 99%   Visual Acuity Right Eye Distance:   Left Eye Distance:   Bilateral Distance:    Right Eye Near:   Left Eye Near:    Bilateral Near:     Physical Exam Vitals and nursing note reviewed.  Constitutional:      Appearance: He is well-developed.     Comments: No acute distress  HENT:     Head: Normocephalic and atraumatic.     Ears:     Comments: Bilateral ears without tenderness to palpation of external auricle, tragus and mastoid, EAC's without erythema or swelling, TM's with good bony landmarks and cone of light. Non erythematous.     Nose: Nose normal.     Mouth/Throat:     Comments: Oral mucosa pink and moist, no tonsillar enlargement or exudate. Posterior pharynx patent and nonerythematous, no uvula deviation or swelling. Normal phonation.  Eyes:     Conjunctiva/sclera: Conjunctivae normal.  Cardiovascular:     Rate and Rhythm: Normal rate.  Pulmonary:     Effort: Pulmonary effort is normal. No respiratory distress.     Comments: Breathing comfortably at rest, CTABL, no wheezing, rales or other adventitious sounds auscultated  Abdominal:     General: There is no distension.  Musculoskeletal:        General: Normal range of motion.     Cervical back: Neck supple.  Skin:    General: Skin is warm and dry.  Neurological:     Mental Status: He is alert and oriented to person, place, and time.      UC Treatments / Results  Labs (all labs ordered are listed, but only abnormal results are displayed) Labs Reviewed - No data to display  EKG   Radiology No results found.  Procedures Procedures (including critical care time)  Medications Ordered in UC Medications - No data to display  Initial Impression / Assessment and Plan / UC Course  I have reviewed the triage vital signs and the nursing notes.  Pertinent labs & imaging results that were available during my care of the  patient were reviewed by me and considered in my medical decision making (see chart for details).     Vital signs stable, exam unremarkable.  Most likely viral versus allergic rhinitis.  Will have continue Zyrtec and Flonase, will add in course of prednisone and albuterol inhaler.  Rest and push fluids.  Deferring any antibiotic therapy at this time, do not suspect sinusitis or underlying pneumonia.  Discussed strict return precautions. Patient verbalized understanding and is agreeable  with plan.  Final Clinical Impressions(s) / UC Diagnoses   Final diagnoses:  Post-nasal drainage  Cough  Viral URI with cough     Discharge Instructions     Continue taking allergy medicine Add in prednisone daily for the next 5 days- take with food and in the morning if you are able Tessalon/benzonatate as needed for cough Albuterol inhaler as needed for wheezing and shortness of breath, chest tightness  Follow up if not improving or worsening   ED Prescriptions    Medication Sig Dispense Auth. Provider   benzonatate (TESSALON) 200 MG capsule Take 1 capsule (200 mg total) by mouth 3 (three) times daily as needed for up to 7 days for cough. 28 capsule ,  C, PA-C   predniSONE (DELTASONE) 50 MG tablet Take 1 tablet (50 mg total) by mouth daily for 5 days. 5 tablet ,  C, PA-C   albuterol (VENTOLIN HFA) 108 (90 Base) MCG/ACT inhaler Inhale 1-2 puffs into the lungs every 6 (six) hours as needed for wheezing or shortness of breath. 8 g , [place] C, PA-C     PDMP not reviewed this encounter.   Lew Dawes, PA-C 10/04/19 1436
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
